# Patient Record
Sex: Female | Born: 1990 | Hispanic: Yes | Marital: Single | Smoking: Never smoker
Health system: Southern US, Community
[De-identification: ages and names within clinical notes are randomized; demographics above are authoritative.]

---

## 2013-10-21 ENCOUNTER — Encounter (HOSPITAL_COMMUNITY): Payer: Self-pay | Admitting: Emergency Medicine

## 2013-10-21 ENCOUNTER — Emergency Department (HOSPITAL_COMMUNITY)
Admission: EM | Admit: 2013-10-21 | Discharge: 2013-10-21 | Disposition: A | Discharge status: Home / Self Care | Payer: Medicaid Other | Attending: Emergency Medicine | Admitting: Emergency Medicine

## 2013-10-21 DIAGNOSIS — K089 Disorder of teeth and supporting structures, unspecified: Secondary | ICD-10-CM | POA: Insufficient documentation

## 2013-10-21 DIAGNOSIS — K029 Dental caries, unspecified: Secondary | ICD-10-CM

## 2013-10-21 MED ORDER — HYDROCODONE-ACETAMINOPHEN 5-325 MG PO TABS: 1.0000 | ORAL_TABLET | Freq: Four times a day (QID) | ORAL | Status: DC | PRN

## 2013-10-21 MED ORDER — PENICILLIN V POTASSIUM 500 MG PO TABS: 500.0000 mg | ORAL_TABLET | Freq: Three times a day (TID) | ORAL | Status: DC

## 2013-10-21 NOTE — Discharge Instructions (Signed)
Dolor dental (Dental Pain) Usted ha consultado con el profesional que lo asiste porque sufre dolor en un diente. ste puede estar ocasionado en caries dentales, las que exponen el nervio del diente al aire, y a temperaturas fras o calientes, lo que Sports administrator. Puede provenir de una infeccin o absceso (tambin denominado fornculo) alrededor del diente, que tambin suele ser causado por caries dentales. Esto produce el dolor que usted siente. DIAGNSTICO El profesional que lo asiste puede diagnosticar el problema con un examen bucal. TRATAMIENTO  Si la causa es una infeccin, puede tratarse con antibiticos (medicamentos que combaten grmenes) y Media planner que Corporate investment banker, como le ha recetado el profesional que lo asiste. Tome la medicacin como se le indic.  Utilice los medicamentos de venta libre o de prescripcin para Chief Technology Officer, Environmental health practitioner o la [place], segn se lo indique el profesional que lo asiste.  Debido a que Nurse, adult generalmente es causado por infeccin o por una enfermedad dental, es aconsejable que acuda a su dentista lo antes posible para que le realice un tratamiento ms profundo. SOLICITE ATENCIN MDICA DE INMEDIATO SI: El examen y el tratamiento que recibi hoy fue indicado slo para hacer frente a la Teaching laboratory technician. No constituye un sustituto de la atencin mdica o Musician. Si su problema empeora o surgen nuevos sntomas (problemas) y no puede concertar una cita con su odontlogo para un pronto seguimiento, llame o acuda nuevamente a Educational psychologist. SOLICITE ATENCIN MDICA DE INMEDIATO SI:  Tiene fiebre.  Presenta enrojecimiento e hinchazn en el rostro, la mandbula o el cuello.  No puede abrir Government social research officer.  Siente un dolor intenso que no puede ser [employer]. EST SEGURO QUE:   Comprende las instrucciones para el alta mdica.  Controlar su enfermedad.  Solicitar atencin mdica de inmediato segn las indicaciones. Document  Released: 05/18/2005 Document Revised: 08/10/2011 [facility] Patient Information 2014 [place], [place].

## 2013-10-21 NOTE — ED Notes (Signed)
Left gum pain for 2 weeks; taking motrin; no facial swelling

## 2013-10-21 NOTE — ED Provider Notes (Signed)
CSN: [id number]     Arrival date & time 10/21/13  1425 History  This chart was scribed for Fayrene Helper, PA-C, working with Nelia Shi, MD by Blanchard Kelch, ED Scribe. This patient was seen in room TR07C/TR07C and the patient's care was started at 2:59 PM.      Chief Complaint  Patient presents with  . Dental Pain      Patient is a 23 y.o. female presenting with tooth pain. The history is provided by the patient. A language interpreter was used.  Dental Pain Associated symptoms: no fever     HPI Comments: Brianna Simpson is a 23 y.o. female who presents to the Emergency Department complaining of intermittent, upper left dental pain that began two weeks ago. She describes the pain as sore. She denies aggravating factors. She has been taking Motrin for the pain without relief. She denies fever, chills, nausea, vomiting or diarrhea. She states that she has a dental appointment on 5/28.   History reviewed. No pertinent past medical history. History reviewed. No pertinent past surgical history. History reviewed. No pertinent family history. History  Substance Use Topics  . Smoking status: Not on file  . Smokeless tobacco: Not on file  . Alcohol Use: Not on file   OB History   Grav Para Term Preterm Abortions TAB SAB Ect Mult Living                 Review of Systems  Constitutional: Negative for fever and chills.  HENT: Positive for dental problem.   Gastrointestinal: Negative for nausea and vomiting.      Allergies  Review of patient's allergies indicates no known allergies.  Home Medications   Prior to Admission medications   Not on File   LMP 09/09/2013 Physical Exam  Nursing note and vitals reviewed. Constitutional: She is oriented to person, place, and time. She appears well-developed and well-nourished. No distress.  HENT:  Head: Normocephalic and atraumatic.  Tooth 14 with significant dental decay and surrounding gingival erythema. No trismus. No  obvious abscess amenable for drainage.  Eyes: EOM are normal.  Neck: Neck supple. No tracheal deviation present.  Cardiovascular: Normal rate.   Pulmonary/Chest: Effort normal. No respiratory distress.  Musculoskeletal: Normal range of motion.  Lymphadenopathy:    She has no cervical adenopathy.  Neurological: She is alert and oriented to person, place, and time.  Skin: Skin is warm and dry.  Psychiatric: She has a normal mood and affect. Her behavior is normal.    ED Course  Procedures (including critical care time)  COORDINATION OF CARE: 3:46 PM -Will prescribe antibiotic and pain medication. Patient verbalizes understanding and agrees with treatment plan.    Labs Review Labs Reviewed - No data to display  Imaging Review No results found.   EKG Interpretation None      MDM   Final diagnoses:  Pain due to dental caries    BP 114/67  Pulse 65  Temp(Src) 97 F (36.1 C) (Oral)  Resp 16  SpO2 100%  LMP 09/09/2013   I personally performed the services described in this documentation, which was scribed in my presence. The recorded information has been reviewed and is accurate.     Fayrene Helper, PA-C 10/21/13 1624

## 2013-10-21 NOTE — ED Notes (Signed)
Triage info per language line.

## 2013-10-22 NOTE — ED Provider Notes (Signed)
Medical screening examination/treatment/procedure(s) were performed by non-physician practitioner and as supervising physician I was immediately available for consultation/collaboration.   Lynde Ludwig L Lanell Dubie, MD 10/22/13 0703 

## 2013-11-11 ENCOUNTER — Encounter (HOSPITAL_COMMUNITY): Payer: Self-pay | Admitting: Emergency Medicine

## 2013-11-11 ENCOUNTER — Emergency Department (HOSPITAL_COMMUNITY): Payer: Medicaid Other

## 2013-11-11 ENCOUNTER — Emergency Department (HOSPITAL_COMMUNITY)
Admission: EM | Admit: 2013-11-11 | Discharge: 2013-11-11 | Disposition: A | Discharge status: Home / Self Care | Payer: Medicaid Other | Attending: Emergency Medicine | Admitting: Emergency Medicine

## 2013-11-11 DIAGNOSIS — R51 Headache: Secondary | ICD-10-CM | POA: Insufficient documentation

## 2013-11-11 DIAGNOSIS — O9989 Other specified diseases and conditions complicating pregnancy, childbirth and the puerperium: Secondary | ICD-10-CM | POA: Insufficient documentation

## 2013-11-11 DIAGNOSIS — M549 Dorsalgia, unspecified: Secondary | ICD-10-CM | POA: Insufficient documentation

## 2013-11-11 DIAGNOSIS — Z792 Long term (current) use of antibiotics: Secondary | ICD-10-CM | POA: Diagnosis not present

## 2013-11-11 DIAGNOSIS — Z3490 Encounter for supervision of normal pregnancy, unspecified, unspecified trimester: Secondary | ICD-10-CM

## 2013-11-11 LAB — URINALYSIS, ROUTINE W REFLEX MICROSCOPIC
Bilirubin Urine: NEGATIVE
GLUCOSE, UA: NEGATIVE mg/dL
Hgb urine dipstick: NEGATIVE
Ketones, ur: NEGATIVE mg/dL
NITRITE: NEGATIVE
PH: 6 (ref 5.0–8.0)
Protein, ur: NEGATIVE mg/dL
SPECIFIC GRAVITY, URINE: 1.021 (ref 1.005–1.030)
Urobilinogen, UA: 0.2 mg/dL (ref 0.0–1.0)

## 2013-11-11 LAB — URINE MICROSCOPIC-ADD ON

## 2013-11-11 LAB — POC URINE PREG, ED: Preg Test, Ur: POSITIVE — AB

## 2013-11-11 LAB — HCG, QUANTITATIVE, PREGNANCY: hCG, Beta Chain, Quant, S: 303 m[IU]/mL — ABNORMAL HIGH (ref <5)

## 2013-11-11 MED ORDER — ACETAMINOPHEN 325 MG PO TABS
650.0000 mg | ORAL_TABLET | Freq: Once | ORAL | Status: AC
  Administered 2013-11-11: 650 mg via ORAL
  Filled 2013-11-11: qty 2

## 2013-11-11 MED ORDER — NITROFURANTOIN MONOHYD MACRO 100 MG PO CAPS: 100.0000 mg | ORAL_CAPSULE | Freq: Two times a day (BID) | ORAL | Status: DC

## 2013-11-11 NOTE — ED Provider Notes (Addendum)
CSN: [id number]     Arrival date & time 11/11/13  1534 History   First MD Initiated Contact with Patient 11/11/13 1555     Chief Complaint  Patient presents with  . Headache     (Consider location/radiation/quality/duration/timing/severity/associated sxs/prior Treatment) Patient is a 23 y.o. female presenting with headaches. The history is provided by the patient. The history is limited by a language barrier. A language interpreter was used.  Headache Pain location:  Generalized Quality:  Dull Radiates to:  Does not radiate Severity currently:  1/10 Severity at highest:  4/10 Onset quality:  Gradual Duration:  2 weeks Timing:  Intermittent Progression:  Waxing and waning Chronicity:  New Similar to prior headaches: yes   Context comment:  Similar to HA when she was pregnant in the past Relieved by:  NSAIDs Worsened by:  Nothing tried Ineffective treatments:  None tried Associated symptoms: back pain, dizziness and nausea   Associated symptoms: no abdominal pain, no blurred vision, no congestion, no cough, no fever, no focal weakness, no hearing loss, no loss of balance and no vomiting   Risk factors comment:  Lmp april   History reviewed. No pertinent past medical history. History reviewed. No pertinent past surgical history. No family history on file. History  Substance Use Topics  . Smoking status: Never Smoker   . Smokeless tobacco: Not on file  . Alcohol Use: No   OB History   Grav Para Term Preterm Abortions TAB SAB Ect Mult Living                 Review of Systems  Constitutional: Negative for fever.  HENT: Negative for congestion and hearing loss.   Eyes: Negative for blurred vision.  Respiratory: Negative for cough.   Gastrointestinal: Positive for nausea. Negative for vomiting and abdominal pain.  Musculoskeletal: Positive for back pain.  Neurological: Positive for dizziness and headaches. Negative for focal weakness and loss of balance.  All other  systems reviewed and are negative.     Allergies  Review of patient's allergies indicates no known allergies.  Home Medications   Prior to Admission medications   Medication Sig Start Date End Date Taking? Authorizing Provider  HYDROcodone-acetaminophen (NORCO/VICODIN) 5-325 MG per tablet Take 1 tablet by mouth every 6 (six) hours as needed for moderate pain. 10/21/13   Fayrene HelperBowie Tran, PA-C  ibuprofen (ADVIL,MOTRIN) 200 MG tablet Take 400 mg by mouth every 4 (four) hours as needed (pain).    Historical Provider, MD  penicillin v potassium (VEETID) 500 MG tablet Take 1 tablet (500 mg total) by mouth 3 (three) times daily. 10/21/13   Fayrene HelperBowie Tran, PA-C   BP 100/67  Pulse 75  Temp(Src) 98.9 F (37.2 C) (Oral)  Resp 18  SpO2 99%  LMP 09/09/2013 Physical Exam  Nursing note and vitals reviewed. Constitutional: She is oriented to person, place, and time. She appears well-developed and well-nourished. No distress.  HENT:  Head: Normocephalic and atraumatic.  Right Ear: Tympanic membrane and ear canal normal.  Left Ear: Tympanic membrane and ear canal normal.  Mouth/Throat: Oropharynx is clear and moist.  Eyes: Conjunctivae and EOM are normal. Pupils are equal, round, and reactive to light.  Neck: Normal range of motion. Neck supple. No spinous process tenderness and no muscular tenderness present.  Cardiovascular: Normal rate, regular rhythm and intact distal pulses.   No murmur heard. Pulmonary/Chest: Effort normal and breath sounds normal. No respiratory distress. She has no wheezes. She has no rales.  Abdominal: Soft.  She exhibits no distension. There is no tenderness. There is no rebound and no guarding.  Musculoskeletal: Normal range of motion. She exhibits no edema and no tenderness.  Neurological: She is alert and oriented to person, place, and time.  Skin: Skin is warm and dry. No rash noted. No erythema.  Psychiatric: She has a normal mood and affect. Her behavior is normal.    ED  Course  Procedures (including critical care time) Labs Review Labs Reviewed  URINALYSIS, ROUTINE W REFLEX MICROSCOPIC - Abnormal; Notable for the following:    APPearance CLOUDY (*)    Leukocytes, UA MODERATE (*)    All other components within normal limits  URINE MICROSCOPIC-ADD ON - Abnormal; Notable for the following:    Squamous Epithelial / LPF FEW (*)    Bacteria, UA MANY (*)    All other components within normal limits  POC URINE PREG, ED - Abnormal; Notable for the following:    Preg Test, Ur POSITIVE (*)    All other components within normal limits  HCG, QUANTITATIVE, PREGNANCY    Imaging Review US Ob Comp Less 14 Wks  11/11/2013   CLINICAL DATA:  Pregnant, beta HCG 303  EXAM: OBSTETRIC <14 WK [place] AND TRANSVAGINAL OB US  TECHNIQUE: Both transabdominal and transvaginal ultrasound examinations were performed for complete evaluation of the gestation as well as the maternal uterus, adnexal regions, and pelvic cul-de-sac. Transvaginal technique was performed to assess early pregnancy.  COMPARISON:  None.  FINDINGS: Intrauterine gestational sac: Not visualized.  Maternal uterus/adnexae: Endometrial complex measures 16 mm.  Suspected 11 x 10 x 11 mm submucosal anterior fundal fibroid.  Right ovary is within normal limits, measuring 2.0 x 2.8 x 3.6 cm.  Left ovary is within normal limits, measuring 3.4 x 2.4 x 2.6 cm.  No free fluid.  IMPRESSION: No IUP is visualized.  By definition, this reflects a pregnancy of unknown location. Primary differential considerations include early normal IUP, abnormal IUP, or nonvisualized ectopic pregnancy.  Serial beta HCG is suggested, supplemented by repeat pelvic sonography in 10-14 days (or earlier as clinically warranted).   Electronically Signed   By: Charline Bills M.D.   On: 11/11/2013 19:14   US Ob Transvaginal  11/11/2013   CLINICAL DATA:  Pregnant, beta HCG 303  EXAM: OBSTETRIC <14 WK [place] AND TRANSVAGINAL OB US  TECHNIQUE: Both transabdominal and  transvaginal ultrasound examinations were performed for complete evaluation of the gestation as well as the maternal uterus, adnexal regions, and pelvic cul-de-sac. Transvaginal technique was performed to assess early pregnancy.  COMPARISON:  None.  FINDINGS: Intrauterine gestational sac: Not visualized.  Maternal uterus/adnexae: Endometrial complex measures 16 mm.  Suspected 11 x 10 x 11 mm submucosal anterior fundal fibroid.  Right ovary is within normal limits, measuring 2.0 x 2.8 x 3.6 cm.  Left ovary is within normal limits, measuring 3.4 x 2.4 x 2.6 cm.  No free fluid.  IMPRESSION: No IUP is visualized.  By definition, this reflects a pregnancy of unknown location. Primary differential considerations include early normal IUP, abnormal IUP, or nonvisualized ectopic pregnancy.  Serial beta HCG is suggested, supplemented by repeat pelvic sonography in 10-14 days (or earlier as clinically warranted).   Electronically Signed   By: Charline Bills M.D.   On: 11/11/2013 19:14     EKG Interpretation None      MDM   Final diagnoses:  Pregnancy at early stage   Patient here with a complaint of generalized headache that resolves with  ibuprofen, nausea and lightheadedness for the last 2 weeks. She states her last menstrual period was in April. She has not checked her urine pregnancy test at home.  She denies fever, neck pain, vision changes, focal weakness. No dysuria, abdominal or flank pain. She occasionally has vaginal pain but it is not constant and does not currently have any now. Exam is normal and patient is well-appearing. Normal vital signs without signs of hypertension.  6:43 PM Unable to visualize IUP by bedside ultrasound. HCG and formal ultrasound ordered.  7:24 PM HCG of 300.  US neg for ectopic or IUP at this time.  Will have pt f/u with [facility] next week for repeat quant.  Pt also appears to have a UTI.  Forgot to prescribe abx so sending to [facility] pharmacy.  Gwyneth SproutWhitney Lametria Klunk,  MD 11/11/13 1925  Gwyneth SproutWhitney Rabecca Birge, MD 11/11/13 [phone]  Gwyneth SproutWhitney Ilena Dieckman, MD 11/11/13 2240

## 2013-11-11 NOTE — ED Notes (Signed)
The pt speaks no english a family member talking for her.  Headache for 2 weeks with nv and dizziness.  lmp April  11

## 2013-11-11 NOTE — ED Notes (Signed)
Pt repeatedly taking herself off blood pressure and pulse ox/cardiac monitoring.

## 2013-11-11 NOTE — ED Notes (Signed)
EDP and nurse went over patients discharge papers with her and her family.

## 2013-11-15 NOTE — ED Provider Notes (Signed)
Dr. Deveron FurlongPlunkitt asked me to performed bedside US for early pregnancy. I thought I was able to see yolk sac initially but not able to confirm it in another view. Ovaries appear nl, no obvious ectopic. HCG was 300. Official US confirmed no obvious IUP.   EMERGENCY DEPARTMENT US PREGNANCY "Study: Limited Ultrasound of the Pelvis"  INDICATIONS:Pregnancy(required) Multiple views of the uterus and pelvic cavity are obtained with a multi-frequency probe.  APPROACH:Transvaginal  PERFORMED BY: Myself  IMAGES ARCHIVED?: Yes  LIMITATIONS: Emergent procedure  PREGNANCY FREE FLUID: None and Small  PREGNANCY UTERUS FINDINGS:Non-specific intrauterine fluid noted ADNEXAL FINDINGS:no obvious ectopic  PREGNANCY FINDINGS: No yolk sac noted and No fetal pole seen  INTERPRETATION: No visualized intrauterine pregnancy   COMMENT(Estimate of Gestational Age):  Not visualized IUP. No obvious ectopic.     Richardean Canalavid H Yao, MD 11/15/13 [phone]

## 2013-11-17 ENCOUNTER — Other Ambulatory Visit: Payer: Medicaid Other

## 2013-11-17 DIAGNOSIS — N925 Other specified irregular menstruation: Secondary | ICD-10-CM

## 2013-11-17 DIAGNOSIS — N949 Unspecified condition associated with female genital organs and menstrual cycle: Secondary | ICD-10-CM

## 2013-11-18 LAB — HCG, QUANTITATIVE, PREGNANCY: hCG, Beta Chain, Quant, S: 2008.9 m[IU]/mL

## 2013-12-10 ENCOUNTER — Encounter (HOSPITAL_COMMUNITY): Payer: Self-pay | Admitting: Emergency Medicine

## 2013-12-10 DIAGNOSIS — O9989 Other specified diseases and conditions complicating pregnancy, childbirth and the puerperium: Secondary | ICD-10-CM | POA: Diagnosis present

## 2013-12-10 DIAGNOSIS — O039 Complete or unspecified spontaneous abortion without complication: Secondary | ICD-10-CM | POA: Insufficient documentation

## 2013-12-10 NOTE — ED Notes (Signed)
Patient here with complaint of pelvic pain which began about a week ago. Was seen and treated for UTI about 1 month ago. Similar symptoms tonight. Known concurrent pregnancy per previous history.

## 2013-12-11 ENCOUNTER — Emergency Department (HOSPITAL_COMMUNITY)
Admission: EM | Admit: 2013-12-11 | Discharge: 2013-12-11 | Disposition: A | Discharge status: Home / Self Care | Payer: Medicaid Other | Attending: Emergency Medicine | Admitting: Emergency Medicine

## 2013-12-11 DIAGNOSIS — O039 Complete or unspecified spontaneous abortion without complication: Secondary | ICD-10-CM

## 2013-12-11 LAB — URINALYSIS, ROUTINE W REFLEX MICROSCOPIC
Bilirubin Urine: NEGATIVE
GLUCOSE, UA: NEGATIVE mg/dL
KETONES UR: 15 mg/dL — AB
Nitrite: NEGATIVE
PH: 6 (ref 5.0–8.0)
PROTEIN: 100 mg/dL — AB
Specific Gravity, Urine: 1.028 (ref 1.005–1.030)
Urobilinogen, UA: 1 mg/dL (ref 0.0–1.0)

## 2013-12-11 LAB — URINE MICROSCOPIC-ADD ON

## 2013-12-11 NOTE — ED Provider Notes (Signed)
CSN: [id number]     Arrival date & time 12/10/13  2336 History   First MD Initiated Contact with Patient 12/11/13 0043     Chief Complaint  Patient presents with  . Pelvic Pain     (Consider location/radiation/quality/duration/timing/severity/associated sxs/prior Treatment) HPI 23 yo female presents to the ER from home with complaint of vaginal bleeding.  Pt reports onset of bleeding on Thursday, light at that time.  She has h/o positive pregnancy test, seen in ed on 6/13 with hcg of 300 but no IUP on u/s.  Pt reports she was seen at ob clinic, and hcg not progressing.  She is due to be seen today for "a shot" to help with miscarriage.  She has had increased bleeding today with passage of clots, and is unsure if she has completed her miscarriage.  Mild cramping/pelvic pain.  No fevers, chills.  No dysuria.  No weakness or dizziness. History reviewed. No pertinent past medical history. History reviewed. No pertinent past surgical history. History reviewed. No pertinent family history. History  Substance Use Topics  . Smoking status: Never Smoker   . Smokeless tobacco: Not on file  . Alcohol Use: No   OB History   Grav Para Term Preterm Abortions TAB SAB Ect Mult Living   1              Review of Systems  All other systems reviewed and are negative.     Allergies  Review of patient's allergies indicates no known allergies.  Home Medications   Prior to Admission medications   Not on File   BP 105/48  Pulse 66  Temp(Src) 98.6 F (37 C) (Oral)  Resp 18  Ht 5\' 4"  (1.626 m)  Wt 206 lb 1.6 oz (93.486 kg)  BMI 35.36 kg/m2  SpO2 100%  LMP 09/09/2013 Physical Exam  Nursing note and vitals reviewed. Constitutional: She is oriented to person, place, and time. She appears well-developed and well-nourished.  HENT:  Head: Normocephalic and atraumatic.  Nose: Nose normal.  Mouth/Throat: Oropharynx is clear and moist.  Eyes: Conjunctivae and EOM are normal. Pupils are equal,  round, and reactive to light.  Neck: Normal range of motion. Neck supple. No JVD present. No tracheal deviation present. No thyromegaly present.  Cardiovascular: Normal rate, regular rhythm, normal heart sounds and intact distal pulses.  Exam reveals no gallop and no friction rub.   No murmur heard. Pulmonary/Chest: Effort normal and breath sounds normal. No stridor. No respiratory distress. She has no wheezes. She has no rales. She exhibits no tenderness.  Abdominal: Soft. Bowel sounds are normal. She exhibits no distension and no mass. There is tenderness (mild llq). There is no rebound and no guarding.  Genitourinary:  External genitalia normal Vagina with dark blood Cervix open, dilated to 2 cm no lesions No cervical motion tenderness Adnexa palpated, no masses or tenderness noted Bladder palpated no tenderness Uterus palpated no masse sbut mild tenderness    Musculoskeletal: Normal range of motion. She exhibits no edema and no tenderness.  Lymphadenopathy:    She has no cervical adenopathy.  Neurological: She is alert and oriented to person, place, and time. She exhibits normal muscle tone. Coordination normal.  Skin: Skin is warm and dry. No rash noted. No erythema. No pallor.  Psychiatric: She has a normal mood and affect. Her behavior is normal. Judgment and thought content normal.    ED Course  Procedures (including critical care time) Labs Review Labs Reviewed  URINALYSIS, ROUTINE W  REFLEX MICROSCOPIC - Abnormal; Notable for the following:    Color, Urine RED (*)    APPearance TURBID (*)    Hgb urine dipstick LARGE (*)    Ketones, ur 15 (*)    Protein, ur 100 (*)    Leukocytes, UA MODERATE (*)    All other components within normal limits  URINE MICROSCOPIC-ADD ON - Abnormal; Notable for the following:    Squamous Epithelial / LPF FEW (*)    Bacteria, UA FEW (*)    All other components within normal limits    Imaging Review No results found.   EKG  Interpretation None      MDM   Final diagnoses:  Inevitable complete miscarriage without complication    23 yo female with vaginal bleeding, open os.  Pt updated that it does appear that she is having a miscarriage, and may not need further intervention.  She has been encouraged to keep her appt already scheduled with OB,in case she requires d&c for any retained products.    Olivia Mackielga M Laquanta Hummel, MD 12/11/13 [phone]

## 2013-12-11 NOTE — Discharge Instructions (Signed)
El cuello uterino est abierto y dilatado en este momento. 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 Por favor, siga con su clnica en la fecha prevista. Lo ms probable es que usted va a Chartered certified accountant aborto involuntario sin necesidad de una nueva intervencin, pero en ocasiones los procedimientos adicionales que tenga que Radio producer. Regreso a Architect (preferiblemente [facility]) para el sangrado ms de una toalla sanitaria cada hora, fiebre, dolor abdominal severo, u otros nuevos relativos a los sntomas.  Aborto espontneo  (Miscarriage) El aborto espontneo es la prdida de un beb que no ha nacido (feto) antes de la semana 20 del Psychiatrist. La mayor parte de estos abortos ocurre en los primeros 3 meses. En algunos casos ocurre antes de que la mujer sepa que est Gregory. Tambin se denomina "aborto espontneo" o "prdida prematura del embarazo". El aborto espontneo puede ser Neomia Dear experiencia que afecte emocionalmente a Dealer. Converse con su mdico si tiene dudas, cmo es el proceso de [place], y sobre planes futuros de Psychiatrist.  CAUSAS   Algunos problemas cromosmicos pueden hacer imposible que el beb se desarrolle normalmente. Los problemas con los genes o cromosomas del beb son generalmente el resultado de errores que se producen, por casualidad, cuando el embrin se divide y crece. Estos problemas no se heredan de los [place].  Infeccin en el cuello del tero.   Problemas hormonales.   Problemas en el cuello del tero, como tener un tero incompetente. Esto ocurre cuando los tejidos no son lo suficientemente fuertes como para Arts administrator.   Problemas del tero, como un tero con forma anormal, los fibromas o anormalidades congnitas.   Ciertas enfermedades crnicas.   No fume, no beba alcohol, ni consuma drogas.   Traumatismos  A veces, la causa es desconocida.  SNTOMAS   Sangrado o manchado vaginal, con o sin clicos o  dolor.  Dolor o clicos en el abdomen o en la cintura.  Eliminacin de lquido, tejidos o cogulos grandes por la vagina. DIAGNSTICO  El [employer] har un examen fsico. Tambin le indicar una ecografa para confirmar el aborto. Es posible que se realicen anlisis de [place].  TRATAMIENTO   En algunos casos el tratamiento no es necesario, si se eliminan naturalmente todos los tejidos embrionarios que se encontraban en el tero. Si el feto o la placenta quedan dentro del tero (aborto incompleto), pueden infectarse, los tejidos que quedan pueden infectarse y deben retirarse. Generalmente se realiza un procedimiento de dilatacin y curetaje (D y C). Durante el procedimiento de dilatacin y curetaje, el cuello del tero se abre (dilata) y se retira cualquier resto de tejido fetal o placentario del tero.  Si hay una infeccin, le recetarn antibiticos. Podrn recetarle otros medicamentos para reducir el tamao del tero (contraerlo) si hay una mucho sangrado.  Si su sangre es Rh negativa y su beb es Rh positivo, usted necesitar la inyeccin de inmunoglobulina Rh. Esta inyeccin proteger a los futuros bebs de tener problemas de compatibilidad Rh en futuros embarazos. INSTRUCCIONES PARA EL CUIDADO EN EL HOGAR   El mdico le indicar reposo en cama o le permitir Dance movement psychotherapist. Vuelva a la actividad lentamente o segn las indicaciones de su mdico.  Pdale a alguien que la ayude con las responsabilidades familiares y del hogar durante este tiempo.   Lleve un registro de la cantidad y la saturacin de las toallas higinicas que Landscape architect. Anote esta informacin   No use tampones.  No No se haga duchas vaginales ni tenga relaciones sexuales hasta que el mdico la autorice.   Slo tome medicamentos de venta libre o recetados para Primary school teachercalmar el dolor o Environmental health practitionerel malestar, segn las indicaciones de su mdico.   No tome aspirina. La aspirina puede ocasionar hemorragias.    Concurra puntualmente a las citas de control con el mdico.   Si usted o su pareja tienen dificultades con el duelo, hable con su mdico para buscar la Bank of New York Companyayuda psicolgica que los ayude a Runner, broadcasting/film/videoenfrentar la prdida del Psychiatristembarazo. Permtase el tiempo suficiente de duelo antes de quedar embarazada nuevamente.  SOLICITE ATENCIN MDICA DE INMEDIATO SI:   Siente calambres intensos o dolor en la espalda o en el abdomen.  Tiene fiebre.  Elimina grandes cogulos de [place] (del tamao de una nuez o ms) o tejidos por la vagina. Guarde lo que ha eliminado para que su mdico lo examine.   La hemorragia aumenta.   Brett Fairybserva una secrecin vaginal espesa y con mal olor.  Se siente mareada, dbil, o se desmaya.   Siente escalofros.  ASEGRESE DE QUE:   Comprende estas instrucciones.  Controlar su enfermedad.  Solicitar ayuda de inmediato si no mejora o si empeora. Document Released: 02/25/2005 Document Revised: 09/12/2012 [facility] Patient Information 2015 [place], [place]. This information is not intended to replace advice given to you by your health care provider. Make sure you discuss any questions you have with your health care provider.

## 2014-01-04 ENCOUNTER — Telehealth: Payer: Self-pay | Admitting: *Deleted

## 2014-01-04 ENCOUNTER — Encounter: Payer: Medicaid Other | Admitting: Obstetrics and Gynecology

## 2014-01-04 NOTE — Telephone Encounter (Signed)
Attempted to contact patient, no answer, left message for patient to call clinic.  After review of records, Dr. Jolayne Pantheronstant states patient needs a bhcg to evaluate levels.

## 2014-01-05 ENCOUNTER — Encounter: Payer: Self-pay | Admitting: *Deleted

## 2014-01-05 NOTE — Telephone Encounter (Signed)
Attempted to contact patient with assistance of Maria(interpreter), no answer, message left in Spanish for patient to call the clinic.

## 2014-01-05 NOTE — Telephone Encounter (Signed)
Letter sent.

## 2014-04-02 ENCOUNTER — Encounter (HOSPITAL_COMMUNITY): Payer: Self-pay | Admitting: Emergency Medicine

## 2014-04-05 ENCOUNTER — Emergency Department (HOSPITAL_COMMUNITY)
Admission: EM | Admit: 2014-04-05 | Discharge: 2014-04-05 | Discharge status: Against Medical Advice | Payer: Medicaid Other | Attending: Emergency Medicine | Admitting: Emergency Medicine

## 2014-04-05 ENCOUNTER — Encounter (HOSPITAL_COMMUNITY): Payer: Self-pay | Admitting: Emergency Medicine

## 2014-04-05 DIAGNOSIS — R112 Nausea with vomiting, unspecified: Secondary | ICD-10-CM | POA: Diagnosis present

## 2014-04-05 DIAGNOSIS — Z3202 Encounter for pregnancy test, result negative: Secondary | ICD-10-CM | POA: Diagnosis not present

## 2014-04-05 DIAGNOSIS — R51 Headache: Secondary | ICD-10-CM | POA: Insufficient documentation

## 2014-04-05 DIAGNOSIS — R110 Nausea: Secondary | ICD-10-CM

## 2014-04-05 LAB — POC URINE PREG, ED: PREG TEST UR: NEGATIVE

## 2014-04-05 MED ORDER — ONDANSETRON 8 MG PO TBDP
8.0000 mg | ORAL_TABLET | Freq: Once | ORAL | Status: DC
Start: 2014-04-05 — End: 2014-04-05

## 2014-04-05 NOTE — ED Notes (Signed)
Patient left AMA, stated if she's not pregnant she's leaving.

## 2014-04-05 NOTE — ED Provider Notes (Signed)
CSN: [id number]     Arrival date & time 04/05/14  1731 History   First MD Initiated Contact with Patient 04/05/14 1922     Chief Complaint  Patient presents with  . Emesis     (Consider location/radiation/quality/duration/timing/severity/associated sxs/prior Treatment) HPI Brianna Simpson is a 23 y.o. female who presents to emergency department complaining of nausea and vomiting. Finish interpreter use, patient speaks Spanish only. Patient states for the last 4 days she has had persistent nausea, intermittent vomiting, occasional headaches. Patient denies any abdominal pain. No urinary symptoms. No vaginal discharge or bleeding. Last menstrual cycle was in September, states she missed 1 in October. Patient denies any fever or chills. She did not try any medications for this. Patient is able to keep fluids and food down. Symptoms are not worsened with eating. she states she just wants to know if she is pregnant.  History reviewed. No pertinent past medical history. History reviewed. No pertinent past surgical history. No family history on file. History  Substance Use Topics  . Smoking status: Never Smoker   . Smokeless tobacco: Not on file  . Alcohol Use: No   OB History    Gravida Para Term Preterm AB TAB SAB Ectopic Multiple Living   1              Review of Systems  Constitutional: Negative for fever and chills.  Respiratory: Negative for cough, chest tightness and shortness of breath.   Cardiovascular: Negative for chest pain, palpitations and leg swelling.  Gastrointestinal: Positive for nausea and vomiting. Negative for abdominal pain and diarrhea.  Genitourinary: Negative for dysuria, urgency, frequency, flank pain, vaginal bleeding, vaginal discharge, vaginal pain and pelvic pain.  Musculoskeletal: Negative for myalgias, arthralgias, neck pain and neck stiffness.  Skin: Negative for rash.  Neurological: Negative for dizziness, weakness and headaches.  All other  systems reviewed and are negative.     Allergies  Review of patient's allergies indicates no known allergies.  Home Medications   Prior to Admission medications   Medication Sig Start Date End Date Taking? Authorizing Provider  ibuprofen (ADVIL,MOTRIN) 200 MG tablet Take 200 mg by mouth every 6 (six) hours as needed for fever or moderate pain.    Yes Historical Provider, MD   BP 138/78 mmHg  Pulse 71  Temp(Src) 98.6 F (37 C) (Oral)  Resp 16  SpO2 100%  LMP 01/30/2014 Physical Exam  Constitutional: She appears well-developed and well-nourished. No distress.  HENT:  Head: Normocephalic.  Eyes: Conjunctivae are normal.  Neck: Neck supple.  Cardiovascular: Normal rate, regular rhythm and normal heart sounds.   Pulmonary/Chest: Effort normal and breath sounds normal. No respiratory distress. She has no wheezes. She has no rales.  Abdominal: Soft. Bowel sounds are normal. She exhibits no distension. There is no tenderness. There is no rebound.  No CVA tenderness bilaterally  Musculoskeletal: She exhibits no edema.  Neurological: She is alert.  Skin: Skin is warm and dry.  Psychiatric: She has a normal mood and affect. Her behavior is normal.  Nursing note and vitals reviewed.   ED Course  Procedures (including critical care time) Labs Review Labs Reviewed  POC URINE PREG, ED    Imaging Review No results found.   EKG Interpretation None      MDM   Final diagnoses:  Nausea   Patient with intermittent nausea and vomiting for 4 days, mild headache. Denies any pain in her abdomen or her back, no dizziness, no neck pain or stiffness.  No fever or chills. Denies any urinary symptoms, no vaginal bleeding or abnormal discharge. Abdomen is benign exam, completely nontender. Will check urinalysis and urine pregnancy.  Patient keeps asking if she is pregnant. Patient's urine pregnancy test was negative, discussed this with patient. Patient does not want to wait for  urinalysis, just wanted to get pregnancy test, I have encouraged her to stay, to determine the cause of her nausea. Patient eloped without telling staff.   Filed Vitals:   04/05/14 1737  BP: 138/78  Pulse: 71  Temp: 98.6 F (37 C)  TempSrc: Oral  Resp: 16  SpO2: 100%     Lottie Musselatyana A Maliki Gignac, PA-C 04/05/14 2340  Suzi RootsKevin E Steinl, MD 04/08/14 [phone]

## 2014-04-05 NOTE — ED Notes (Signed)
Per patient states she has been vomiting for 4 days

## 2014-10-17 IMAGING — US US OB TRANSVAGINAL
1 series · 14 of 28 positions shown · non-contrast
Comparison: None.

CLINICAL DATA: Pregnant, beta HCG 303

EXAM:
OBSTETRIC <14 WK US AND TRANSVAGINAL OB US
TECHNIQUE: Both transabdominal and transvaginal ultrasound examinations were
performed for complete evaluation of the gestation as well as the
maternal uterus, adnexal regions, and pelvic cul-de-sac.
Transvaginal technique was performed to assess early pregnancy.

[Series 1: us ob transvaginal · 0.18mm/px · 14 of 55 slices shown]
[im 3/55]
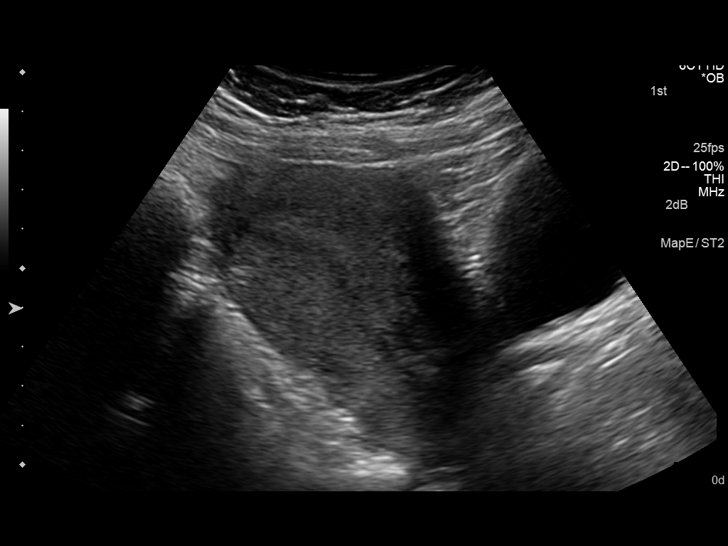
[im 7/55]
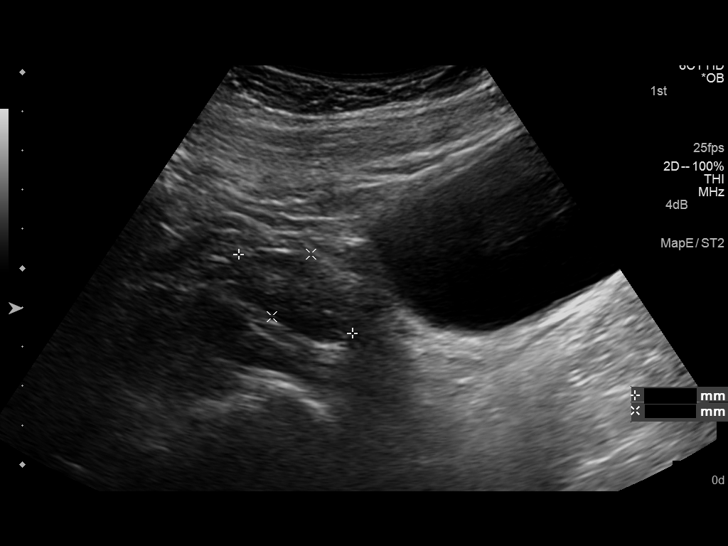
[im 11/55]
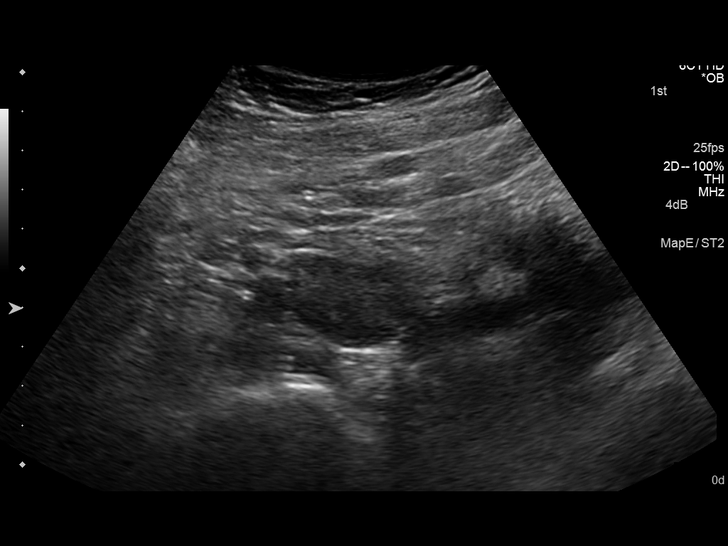
[im 15/55]
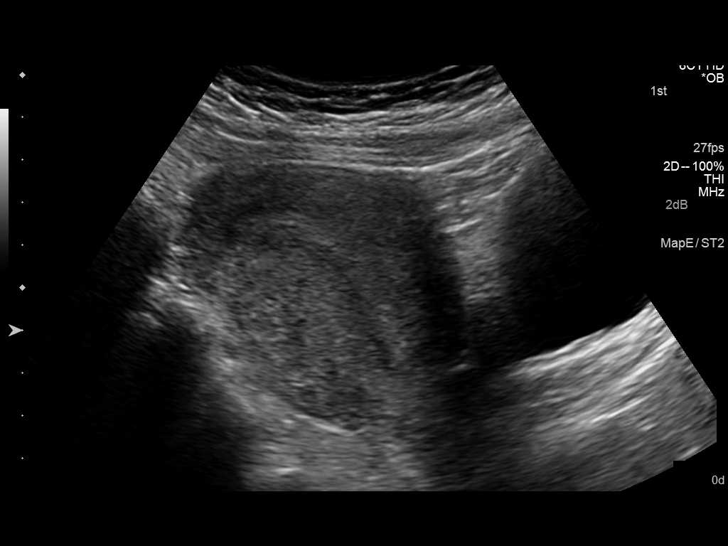
[im 19/55]
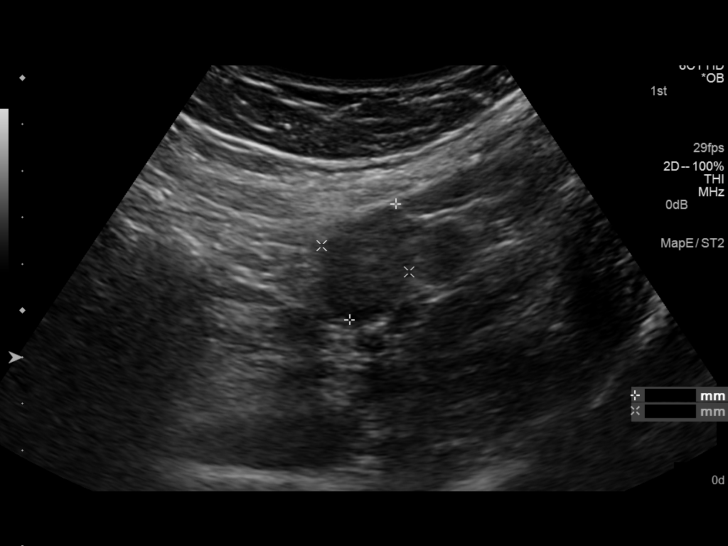
[im 23/55]
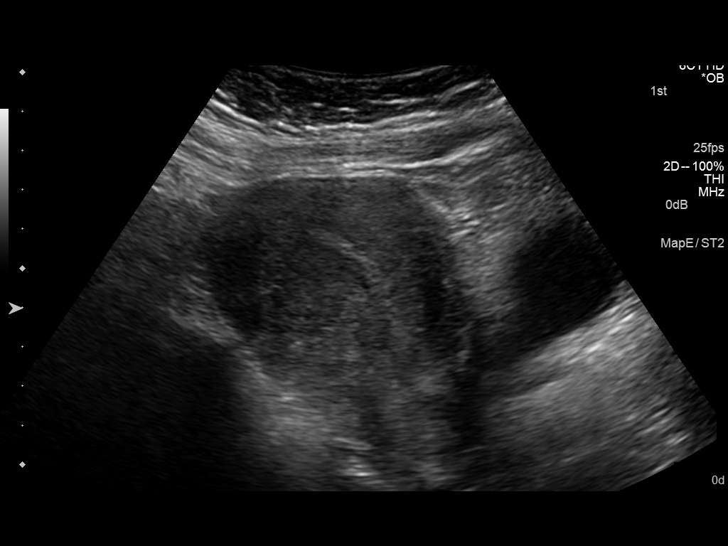
[im 27/55]
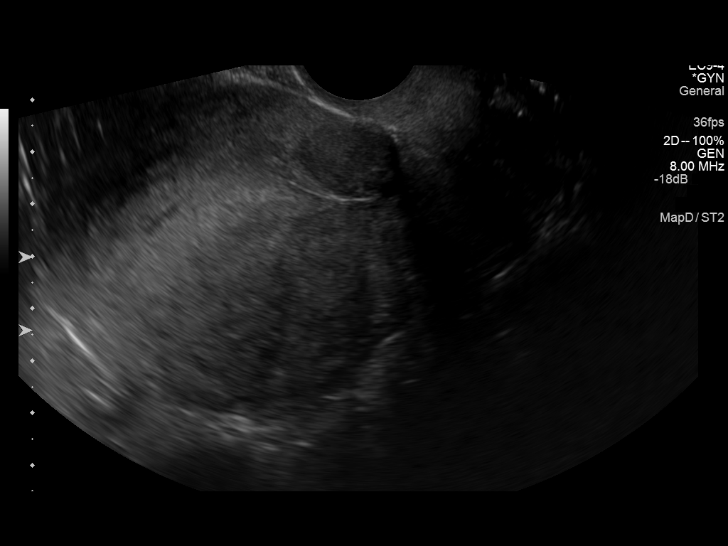
[im 31/55]
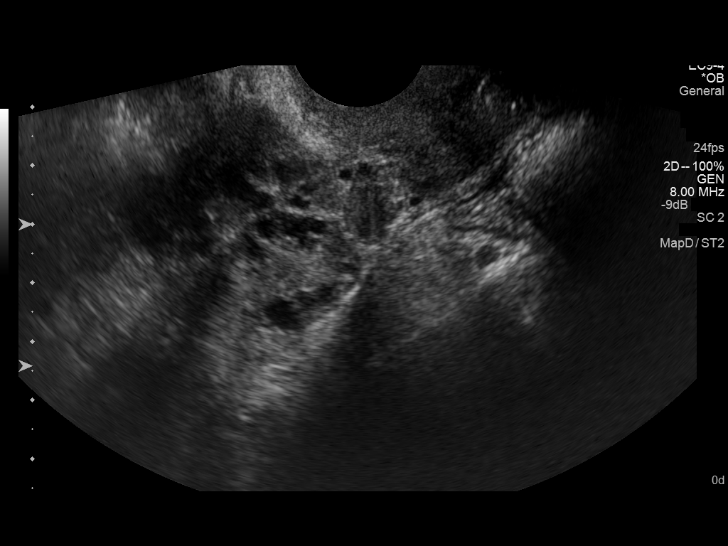
[im 35/55]
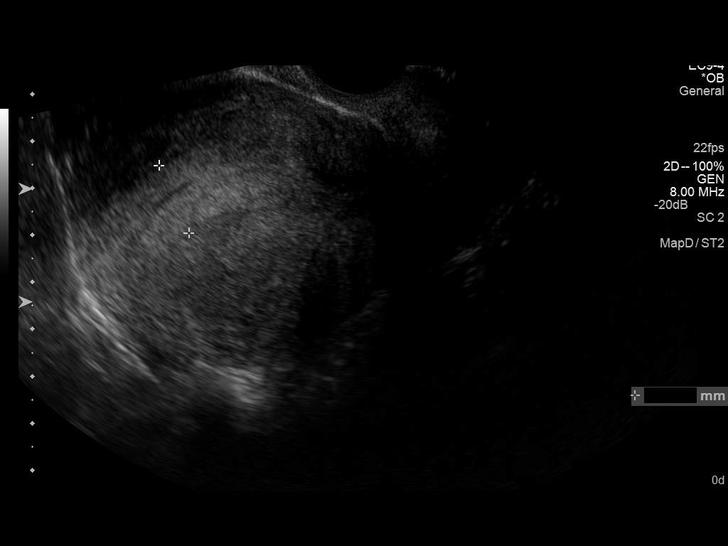
[im 39/55]
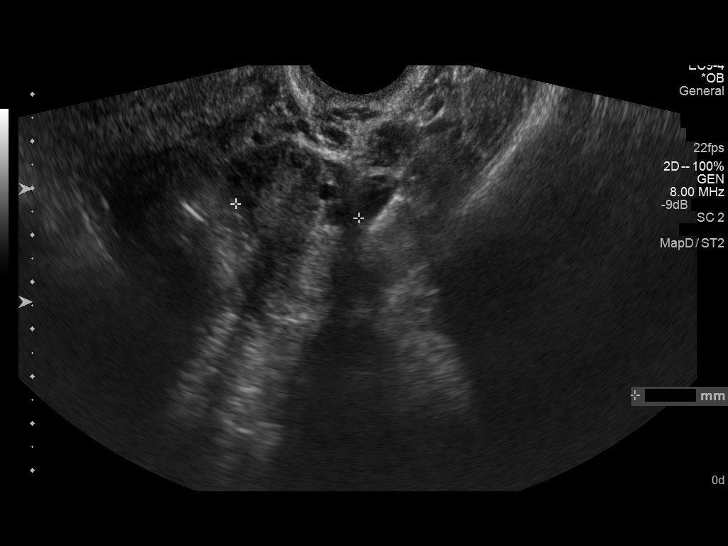
[im 43/55]
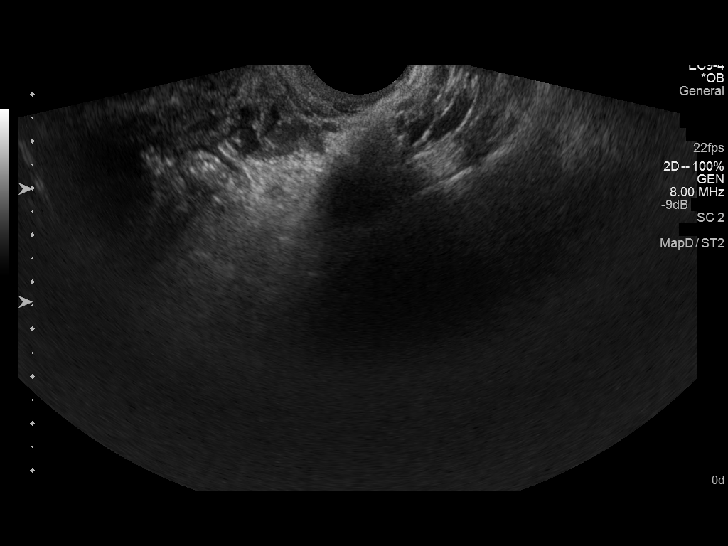
[im 47/55]
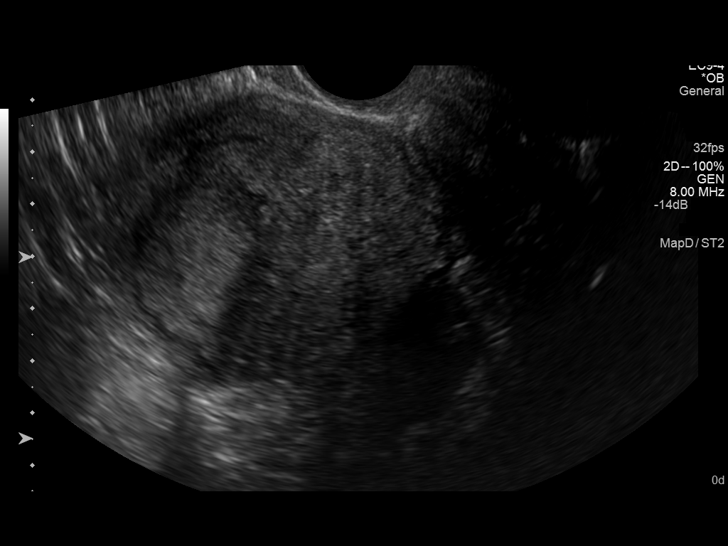
[im 51/55]
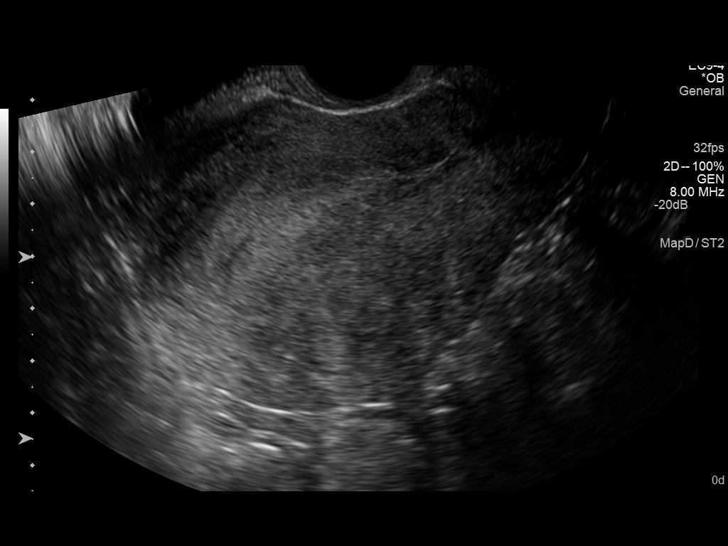
[im 55/55]
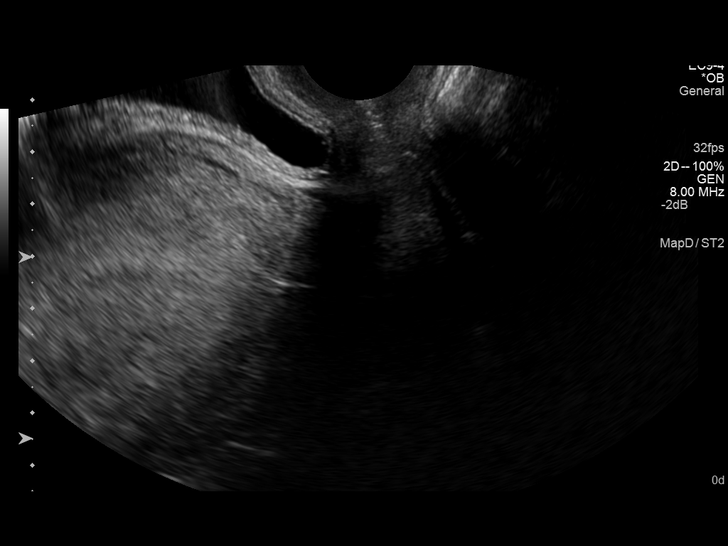

[14 of 28 positions shown; findings below may reference images not displayed]

FINDINGS: Intrauterine gestational sac: Not visualized.

Maternal uterus/adnexae: Endometrial complex measures 16 mm.

Suspected 11 x 10 x 11 mm submucosal anterior fundal fibroid.

Right ovary is within normal limits, measuring 2.0 x 2.8 x 3.6 cm.

Left ovary is within normal limits, measuring 3.4 x 2.4 x 2.6 cm.

No free fluid.
IMPRESSION: No IUP is visualized.

By definition, this reflects a pregnancy of unknown location.
Primary differential considerations include early normal IUP,
abnormal IUP, or nonvisualized ectopic pregnancy.

Serial beta HCG is suggested, supplemented by repeat pelvic
sonography in 10-14 days (or earlier as clinically warranted).

## 2024-04-09 ENCOUNTER — Emergency Department (HOSPITAL_COMMUNITY)
Admission: EM | Admit: 2024-04-09 | Discharge: 2024-04-09 | Discharge status: Against Medical Advice | Attending: Emergency Medicine | Admitting: Emergency Medicine

## 2024-04-09 ENCOUNTER — Encounter (HOSPITAL_COMMUNITY): Payer: Self-pay

## 2024-04-09 ENCOUNTER — Other Ambulatory Visit: Payer: Self-pay

## 2024-04-09 DIAGNOSIS — R220 Localized swelling, mass and lump, head: Secondary | ICD-10-CM | POA: Diagnosis present

## 2024-04-09 DIAGNOSIS — K0889 Other specified disorders of teeth and supporting structures: Secondary | ICD-10-CM | POA: Insufficient documentation

## 2024-04-09 DIAGNOSIS — Z5321 Procedure and treatment not carried out due to patient leaving prior to being seen by health care provider: Secondary | ICD-10-CM | POA: Insufficient documentation

## 2024-04-09 LAB — CBC WITH DIFFERENTIAL/PLATELET
Abs Immature Granulocytes: 0.02 K/uL (ref 0.00–0.07)
Basophils Absolute: 0.1 K/uL (ref 0.0–0.1)
Basophils Relative: 1 %
Eosinophils Absolute: 0.3 K/uL (ref 0.0–0.5)
Eosinophils Relative: 4 %
HCT: 39 % (ref 36.0–46.0)
Hemoglobin: 12.8 g/dL (ref 12.0–15.0)
Immature Granulocytes: 0 %
Lymphocytes Relative: 29 %
Lymphs Abs: 2.7 K/uL (ref 0.7–4.0)
MCH: 29.2 pg (ref 26.0–34.0)
MCHC: 32.8 g/dL (ref 30.0–36.0)
MCV: 89 fL (ref 80.0–100.0)
Monocytes Absolute: 0.7 K/uL (ref 0.1–1.0)
Monocytes Relative: 8 %
Neutro Abs: 5.6 K/uL (ref 1.7–7.7)
Neutrophils Relative %: 58 %
Platelets: 425 K/uL — ABNORMAL HIGH (ref 150–400)
RBC: 4.38 MIL/uL (ref 3.87–5.11)
RDW: 12.3 % (ref 11.5–15.5)
WBC: 9.4 K/uL (ref 4.0–10.5)
nRBC: 0 % (ref 0.0–0.2)

## 2024-04-09 LAB — COMPREHENSIVE METABOLIC PANEL WITH GFR
ALT: 8 U/L (ref 0–44)
AST: 14 U/L — ABNORMAL LOW (ref 15–41)
Albumin: 2.6 g/dL — ABNORMAL LOW (ref 3.5–5.0)
Alkaline Phosphatase: 77 U/L (ref 38–126)
Anion gap: 12 (ref 5–15)
BUN: 7 mg/dL (ref 6–20)
CO2: 22 mmol/L (ref 22–32)
Calcium: 8.3 mg/dL — ABNORMAL LOW (ref 8.9–10.3)
Chloride: 102 mmol/L (ref 98–111)
Creatinine, Ser: 0.57 mg/dL (ref 0.44–1.00)
GFR, Estimated: >60 mL/min (ref >60)
Glucose, Bld: 132 mg/dL — ABNORMAL HIGH (ref 70–99)
Potassium: 3.4 mmol/L — ABNORMAL LOW (ref 3.5–5.1)
Sodium: 136 mmol/L (ref 135–145)
Total Bilirubin: 0.3 mg/dL (ref 0.0–1.2)
Total Protein: 7.2 g/dL (ref 6.5–8.1)

## 2024-04-09 LAB — I-STAT CG4 LACTIC ACID, ED: Lactic Acid, Venous: 1.6 mmol/L (ref 0.5–1.9)

## 2024-04-09 LAB — HCG, SERUM, QUALITATIVE: Preg, Serum: NEGATIVE

## 2024-04-09 NOTE — ED Triage Notes (Signed)
 Pt came in POV with complaints of facial swelling on the right side. She endorses tooth pain on the right side and swelling that happened due to the tooth. Patient says it started 3 days ago. The patient states she was taking an antibiotic that friend gave her with no relief.. She states she took an amoxicillin and 1pm. 7/10 pain.
